# Patient Record
Sex: Female | Born: 1980 | Hispanic: No | Marital: Single | Smoking: Current every day smoker
Health system: Southern US, Community
[De-identification: ages and names within clinical notes are randomized; demographics above are authoritative.]

## PROBLEM LIST (undated history)

## (undated) DIAGNOSIS — R251 Tremor, unspecified: Secondary | ICD-10-CM

## (undated) DIAGNOSIS — E079 Disorder of thyroid, unspecified: Secondary | ICD-10-CM

## (undated) DIAGNOSIS — E119 Type 2 diabetes mellitus without complications: Secondary | ICD-10-CM

## (undated) HISTORY — PX: ADENOIDECTOMY: SUR15

---

## 2008-01-25 ENCOUNTER — Ambulatory Visit: Payer: Self-pay | Admitting: Internal Medicine

## 2008-01-25 ENCOUNTER — Encounter: Payer: Self-pay | Admitting: Family Medicine

## 2008-01-25 LAB — CONVERTED CEMR LAB
BUN: 12 mg/dL (ref 6–23)
Basophils Relative: 1 % (ref 0–1)
CO2: 26 meq/L (ref 19–32)
Calcium: 9 mg/dL (ref 8.4–10.5)
Chloride: 105 meq/L (ref 96–112)
Cholesterol: 203 mg/dL — ABNORMAL HIGH (ref 0–200)
Creatinine, Ser: 0.71 mg/dL (ref 0.40–1.20)
Eosinophils Relative: 3 % (ref 0–5)
HCT: 40.3 % (ref 36.0–46.0)
HDL: 72 mg/dL (ref >39)
Hemoglobin: 12.7 g/dL (ref 12.0–15.0)
MCHC: 31.5 g/dL (ref 30.0–36.0)
Monocytes Absolute: 0.5 10*3/uL (ref 0.1–1.0)
Monocytes Relative: 5 % (ref 3–12)
Neutro Abs: 7 10*3/uL (ref 1.7–7.7)
RBC: 5.02 M/uL (ref 3.87–5.11)
RDW: 15.3 % (ref 11.5–15.5)
Total Bilirubin: 0.7 mg/dL (ref 0.3–1.2)
Total CHOL/HDL Ratio: 2.8
VLDL: 38 mg/dL (ref 0–40)

## 2008-02-27 ENCOUNTER — Ambulatory Visit: Payer: Self-pay | Admitting: Internal Medicine

## 2008-02-27 ENCOUNTER — Encounter: Payer: Self-pay | Admitting: Family Medicine

## 2008-02-27 LAB — CONVERTED CEMR LAB
Free T4: 1.65 ng/dL (ref 0.89–1.80)
TSH: 0.005 microintl units/mL — ABNORMAL LOW (ref 0.350–4.50)

## 2008-03-04 ENCOUNTER — Ambulatory Visit: Payer: Self-pay | Admitting: *Deleted

## 2008-03-12 ENCOUNTER — Encounter (HOSPITAL_COMMUNITY): Admission: RE | Admit: 2008-03-12 | Discharge: 2008-04-15 | Payer: Self-pay | Admitting: Cardiology

## 2008-04-14 ENCOUNTER — Ambulatory Visit: Payer: Self-pay | Admitting: Internal Medicine

## 2008-04-18 ENCOUNTER — Encounter (HOSPITAL_COMMUNITY): Admission: RE | Admit: 2008-04-18 | Discharge: 2008-05-22 | Payer: Self-pay | Admitting: Internal Medicine

## 2008-05-13 ENCOUNTER — Ambulatory Visit: Payer: Self-pay | Admitting: Internal Medicine

## 2008-05-13 ENCOUNTER — Encounter: Payer: Self-pay | Admitting: Family Medicine

## 2008-05-13 LAB — CONVERTED CEMR LAB
Free T4: 3.07 ng/dL — ABNORMAL HIGH (ref 0.89–1.80)
Microalb, Ur: 0.22 mg/dL (ref 0.00–1.89)
TSH: 0.007 microintl units/mL — ABNORMAL LOW (ref 0.350–4.50)

## 2008-05-26 ENCOUNTER — Ambulatory Visit: Payer: Self-pay | Admitting: Family Medicine

## 2008-10-27 ENCOUNTER — Encounter: Payer: Self-pay | Admitting: Family Medicine

## 2008-10-27 ENCOUNTER — Ambulatory Visit: Payer: Self-pay | Admitting: Family Medicine

## 2008-12-24 ENCOUNTER — Emergency Department (HOSPITAL_BASED_OUTPATIENT_CLINIC_OR_DEPARTMENT_OTHER): Admission: EM | Admit: 2008-12-24 | Discharge: 2008-12-24 | Payer: Self-pay | Admitting: Emergency Medicine

## 2009-01-07 ENCOUNTER — Ambulatory Visit: Payer: Self-pay | Admitting: Internal Medicine

## 2009-02-10 ENCOUNTER — Ambulatory Visit: Payer: Self-pay | Admitting: Internal Medicine

## 2009-03-26 ENCOUNTER — Ambulatory Visit: Payer: Self-pay | Admitting: Internal Medicine

## 2009-07-08 ENCOUNTER — Encounter: Payer: Self-pay | Admitting: Family Medicine

## 2009-07-08 ENCOUNTER — Ambulatory Visit: Payer: Self-pay | Admitting: Internal Medicine

## 2009-07-08 LAB — CONVERTED CEMR LAB
ALT: 13 units/L (ref 0–35)
Alkaline Phosphatase: 58 units/L (ref 39–117)
Basophils Absolute: 0.1 10*3/uL (ref 0.0–0.1)
Basophils Relative: 1 % (ref 0–1)
CO2: 22 meq/L (ref 19–32)
Creatinine, Ser: 0.72 mg/dL (ref 0.40–1.20)
Eosinophils Absolute: 0.4 10*3/uL (ref 0.0–0.7)
MCHC: 31.9 g/dL (ref 30.0–36.0)
MCV: 83.2 fL (ref 78.0–100.0)
Monocytes Relative: 5 % (ref 3–12)
Neutrophils Relative %: 67 % (ref 43–77)
Platelets: 267 10*3/uL (ref 150–400)
RDW: 14.7 % (ref 11.5–15.5)
TSH: 2.515 microintl units/mL (ref 0.350–4.500)
Total Bilirubin: 0.5 mg/dL (ref 0.3–1.2)

## 2009-10-02 ENCOUNTER — Emergency Department (HOSPITAL_BASED_OUTPATIENT_CLINIC_OR_DEPARTMENT_OTHER): Admission: EM | Admit: 2009-10-02 | Discharge: 2009-10-02 | Payer: Self-pay | Admitting: Emergency Medicine

## 2009-11-26 ENCOUNTER — Emergency Department (HOSPITAL_BASED_OUTPATIENT_CLINIC_OR_DEPARTMENT_OTHER): Admission: EM | Admit: 2009-11-26 | Discharge: 2009-11-26 | Payer: Self-pay | Admitting: Emergency Medicine

## 2010-01-11 ENCOUNTER — Ambulatory Visit: Payer: Self-pay | Admitting: Internal Medicine

## 2010-03-19 ENCOUNTER — Ambulatory Visit: Payer: Self-pay | Admitting: Family Medicine

## 2010-03-22 ENCOUNTER — Ambulatory Visit: Payer: Self-pay | Admitting: Internal Medicine

## 2010-08-05 ENCOUNTER — Encounter (INDEPENDENT_AMBULATORY_CARE_PROVIDER_SITE_OTHER): Payer: Self-pay | Admitting: Internal Medicine

## 2010-11-08 ENCOUNTER — Encounter (INDEPENDENT_AMBULATORY_CARE_PROVIDER_SITE_OTHER): Payer: Self-pay | Admitting: Nurse Practitioner

## 2010-11-16 NOTE — Progress Notes (Signed)
Summary: Office Visit//HEALTH SCREEN  Office Visit//HEALTH SCREEN   Imported By: Arta Bruce 11/12/2010 16:10:30  _____________________________________________________________________  External Attachment:    Type:   Image     Comment:   External Document

## 2010-11-17 LAB — CULTURE, ROUTINE-ABSCESS: Gram Stain: NONE SEEN

## 2011-10-01 ENCOUNTER — Emergency Department (HOSPITAL_BASED_OUTPATIENT_CLINIC_OR_DEPARTMENT_OTHER)
Admission: EM | Admit: 2011-10-01 | Discharge: 2011-10-01 | Disposition: A | Discharge status: Home / Self Care | Payer: Medicaid Other | Attending: Emergency Medicine | Admitting: Emergency Medicine

## 2011-10-01 ENCOUNTER — Encounter (HOSPITAL_BASED_OUTPATIENT_CLINIC_OR_DEPARTMENT_OTHER): Payer: Self-pay | Admitting: *Deleted

## 2011-10-01 DIAGNOSIS — E079 Disorder of thyroid, unspecified: Secondary | ICD-10-CM | POA: Insufficient documentation

## 2011-10-01 DIAGNOSIS — F17200 Nicotine dependence, unspecified, uncomplicated: Secondary | ICD-10-CM | POA: Insufficient documentation

## 2011-10-01 DIAGNOSIS — L089 Local infection of the skin and subcutaneous tissue, unspecified: Secondary | ICD-10-CM

## 2011-10-01 HISTORY — DX: Tremor, unspecified: R25.1

## 2011-10-01 HISTORY — DX: Disorder of thyroid, unspecified: E07.9

## 2011-10-01 MED ORDER — DOXYCYCLINE HYCLATE 100 MG PO CAPS: 100.0000 mg | ORAL_CAPSULE | Freq: Two times a day (BID) | ORAL | Status: AC

## 2011-10-01 NOTE — ED Provider Notes (Signed)
History     CSN: [id number]  Arrival date & time 10/01/11  1751   First MD Initiated Contact with Patient 10/01/11 1803      Chief Complaint  Patient presents with  . Blister    (Consider location/radiation/quality/duration/timing/severity/associated sxs/prior treatment) HPI Comments: Pt states that she has a history of staph infection which started similarly, but her son has had ringworm and she wanted to make sure of what this spot was  Patient is a 31 y.o. female presenting with rash. The history is provided by the patient. No language interpreter was used.  Rash  This is a new problem. The current episode started more than 2 days ago. The problem has been gradually worsening. The problem is associated with an unknown factor. There has been no fever. The rash is present on the right arm. The pain is mild. The pain has been constant since onset. Associated symptoms include itching. Pertinent negatives include no weeping. She has tried antibiotic cream for the symptoms. The treatment provided no relief.    Past Medical History  Diagnosis Date  . Thyroid disease   . Tremors of nervous system     Past Surgical History  Procedure Date  . Adenoidectomy     History reviewed. No pertinent family history.  History  Substance Use Topics  . Smoking status: Current Everyday Smoker  . Smokeless tobacco: Not on file  . Alcohol Use: Yes    OB History    Grav Para Term Preterm Abortions TAB SAB Ect Mult Living                  Review of Systems  HENT: Negative.   Respiratory: Negative.   Cardiovascular: Negative.   Skin: Positive for itching and rash.    Allergies  Review of patient's allergies indicates no known allergies.  Home Medications   Current Outpatient Rx  Name Route Sig Dispense Refill  . IBUPROFEN 200 MG PO TABS Oral Take 800 mg by mouth every 6 (six) hours as needed. For pain    . LEVOTHYROXINE SODIUM 100 MCG PO TABS Oral Take 100 mcg by mouth daily.      Marland Kitchen NADOLOL 20 MG PO TABS Oral Take 20 mg by mouth daily.      BP 121/63  Pulse 62  Temp(Src) 97.9 F (36.6 C) (Oral)  Resp 18  Ht 5\' 2"  (1.575 m)  Wt 153 lb (69.4 kg)  BMI 27.98 kg/m2  SpO2 100%  Physical Exam  Nursing note and vitals reviewed. Constitutional: She appears well-developed and well-nourished.  Cardiovascular: Normal rate and regular rhythm.   Pulmonary/Chest: Effort normal.  Skin:       Pt has a localized area that is red raised and weepy    ED Course  Procedures (including critical care time)  Labs Reviewed - No data to display No results found.   1. Skin infection       MDM  Consistent with early abscess:will treat        Teressa Lower, NP 10/01/11 1821

## 2011-10-01 NOTE — ED Notes (Signed)
Pt has raised circular area to right forearm for a couple of days. Son has had ringworm twice. Itches at times.

## 2011-10-02 NOTE — ED Provider Notes (Signed)
Medical screening examination/treatment/procedure(s) were performed by non-physician practitioner and as supervising physician I was immediately available for consultation/collaboration.  Joel Mericle, MD 10/02/11 0754 

## 2013-03-13 ENCOUNTER — Ambulatory Visit: Payer: Medicaid Other | Admitting: Family

## 2014-07-11 ENCOUNTER — Emergency Department (HOSPITAL_BASED_OUTPATIENT_CLINIC_OR_DEPARTMENT_OTHER)
Admission: EM | Admit: 2014-07-11 | Discharge: 2014-07-11 | Disposition: A | Discharge status: Home / Self Care | Payer: Managed Care, Other (non HMO) | Attending: Emergency Medicine | Admitting: Emergency Medicine

## 2014-07-11 ENCOUNTER — Encounter (HOSPITAL_BASED_OUTPATIENT_CLINIC_OR_DEPARTMENT_OTHER): Payer: Self-pay | Admitting: *Deleted

## 2014-07-11 DIAGNOSIS — E079 Disorder of thyroid, unspecified: Secondary | ICD-10-CM | POA: Diagnosis not present

## 2014-07-11 DIAGNOSIS — E119 Type 2 diabetes mellitus without complications: Secondary | ICD-10-CM | POA: Insufficient documentation

## 2014-07-11 DIAGNOSIS — Z720 Tobacco use: Secondary | ICD-10-CM | POA: Diagnosis not present

## 2014-07-11 DIAGNOSIS — Z3202 Encounter for pregnancy test, result negative: Secondary | ICD-10-CM | POA: Insufficient documentation

## 2014-07-11 DIAGNOSIS — N390 Urinary tract infection, site not specified: Secondary | ICD-10-CM | POA: Insufficient documentation

## 2014-07-11 DIAGNOSIS — R5383 Other fatigue: Secondary | ICD-10-CM | POA: Diagnosis present

## 2014-07-11 DIAGNOSIS — Z79899 Other long term (current) drug therapy: Secondary | ICD-10-CM | POA: Insufficient documentation

## 2014-07-11 HISTORY — DX: Type 2 diabetes mellitus without complications: E11.9

## 2014-07-11 LAB — URINALYSIS, ROUTINE W REFLEX MICROSCOPIC
Bilirubin Urine: NEGATIVE
GLUCOSE, UA: NEGATIVE mg/dL
Hgb urine dipstick: NEGATIVE
KETONES UR: NEGATIVE mg/dL
NITRITE: NEGATIVE
PROTEIN: NEGATIVE mg/dL
Specific Gravity, Urine: 1.012 (ref 1.005–1.030)
Urobilinogen, UA: 0.2 mg/dL (ref 0.0–1.0)
pH: 5.5 (ref 5.0–8.0)

## 2014-07-11 LAB — URINE MICROSCOPIC-ADD ON

## 2014-07-11 LAB — PREGNANCY, URINE: PREG TEST UR: NEGATIVE

## 2014-07-11 MED ORDER — CEPHALEXIN 500 MG PO CAPS: 500.0000 mg | ORAL_CAPSULE | Freq: Two times a day (BID) | ORAL | Status: DC

## 2014-07-11 NOTE — ED Notes (Addendum)
Pt c/o warm, flushed feeling with fatigue that began at about 9:30 am today. She sts the episode lasted apprx. 15 min and then resolved. Pt c/o now of fatigue and hands sweating. Pt thinks it may be her thyroid. Pt was diabetic but lost weight. Her cbg this am was 109.

## 2014-07-11 NOTE — ED Provider Notes (Signed)
CSN: [id number]     Arrival date & time 07/11/14  1029 History   First MD Initiated Contact with Patient 07/11/14 1208     Chief Complaint  Patient presents with  . Fatigue     (Consider location/radiation/quality/duration/timing/severity/associated sxs/prior Treatment) HPI Comments: Pt comes in the today after she was flushed and felt fatigue. She states that after about 15 minutes the symptoms resolved. She states that she has this if her blood sugar gets low but she checked it and it was 109. She also sometimes has the symptoms when her thyroid is off. She thought maybe we could check that since she doesn't have time to go to her doctor. Denies fever, nausea, vomiting or abdominal pain.  The history is provided by the patient. No language interpreter was used.    Past Medical History  Diagnosis Date  . Thyroid disease   . Tremors of nervous system   . Diabetes mellitus without complication    Past Surgical History  Procedure Laterality Date  . Adenoidectomy     No family history on file. History  Substance Use Topics  . Smoking status: Current Every Day Smoker  . Smokeless tobacco: Not on file  . Alcohol Use: Yes   OB History    No data available     Review of Systems  All other systems reviewed and are negative.     Allergies  Review of patient's allergies indicates no known allergies.  Home Medications   Prior to Admission medications   Medication Sig Start Date End Date Taking? Authorizing Provider  cephALEXin (KEFLEX) 500 MG capsule Take 1 capsule (500 mg total) by mouth 2 (two) times daily. 07/11/14   Teressa LowerVrinda Debraann Livingstone, NP  ibuprofen (ADVIL,MOTRIN) 200 MG tablet Take 800 mg by mouth every 6 (six) hours as needed. For pain    Historical Provider, MD  levothyroxine (SYNTHROID, LEVOTHROID) 100 MCG tablet Take 100 mcg by mouth daily.    Historical Provider, MD  nadolol (CORGARD) 20 MG tablet Take 40 mg by mouth daily.     Historical Provider, MD   BP 119/80  mmHg  Pulse 60  Temp(Src) 98.9 F (37.2 C) (Oral)  Resp 14  Ht 5' 2.5" (1.588 m)  Wt 190 lb (86.183 kg)  BMI 34.18 kg/m2  SpO2 99%  LMP 06/11/2014 Physical Exam  Constitutional: She is oriented to person, place, and time. She appears well-developed and well-nourished.  HENT:  Head: Normocephalic and atraumatic.  Cardiovascular: Normal rate and regular rhythm.   Pulmonary/Chest: Effort normal and breath sounds normal.  Abdominal: Soft. Bowel sounds are normal. There is no tenderness.  Musculoskeletal: Normal range of motion.  Neurological: She is alert and oriented to person, place, and time.  Skin: Skin is warm and dry.  Psychiatric: She has a normal mood and affect.  Nursing note and vitals reviewed.   ED Course  Procedures (including critical care time) Labs Review Labs Reviewed  URINALYSIS, ROUTINE W REFLEX MICROSCOPIC - Abnormal; Notable for the following:    Leukocytes, UA TRACE (*)    All other components within normal limits  URINE MICROSCOPIC-ADD ON - Abnormal; Notable for the following:    Squamous Epithelial / LPF FEW (*)    Bacteria, UA MANY (*)    All other components within normal limits  PREGNANCY, URINE    Imaging Review No results found.   EKG Interpretation None      MDM   Final diagnoses:  UTI (lower urinary tract infection)  Will treat for uti. Discussed with pt that don't think that thyroid needs to be checked urgently. Discussed with pt that she can have that done at her doctors office    Teressa LowerVrinda Lowery Paullin, NP 07/11/14 1227  Glynn OctaveStephen Rancour, MD 07/11/14 1521

## 2014-07-11 NOTE — Discharge Instructions (Signed)

## 2015-12-28 ENCOUNTER — Emergency Department (HOSPITAL_BASED_OUTPATIENT_CLINIC_OR_DEPARTMENT_OTHER): Payer: BLUE CROSS/BLUE SHIELD

## 2015-12-28 ENCOUNTER — Encounter (HOSPITAL_BASED_OUTPATIENT_CLINIC_OR_DEPARTMENT_OTHER): Payer: Self-pay | Admitting: *Deleted

## 2015-12-28 ENCOUNTER — Emergency Department (HOSPITAL_BASED_OUTPATIENT_CLINIC_OR_DEPARTMENT_OTHER)
Admission: EM | Admit: 2015-12-28 | Discharge: 2015-12-29 | Disposition: A | Discharge status: Home / Self Care | Payer: BLUE CROSS/BLUE SHIELD | Attending: Emergency Medicine | Admitting: Emergency Medicine

## 2015-12-28 DIAGNOSIS — F17210 Nicotine dependence, cigarettes, uncomplicated: Secondary | ICD-10-CM | POA: Diagnosis not present

## 2015-12-28 DIAGNOSIS — E119 Type 2 diabetes mellitus without complications: Secondary | ICD-10-CM | POA: Insufficient documentation

## 2015-12-28 DIAGNOSIS — M79641 Pain in right hand: Secondary | ICD-10-CM | POA: Insufficient documentation

## 2015-12-28 DIAGNOSIS — R51 Headache: Secondary | ICD-10-CM | POA: Diagnosis present

## 2015-12-28 LAB — PREGNANCY, URINE: Preg Test, Ur: NEGATIVE

## 2015-12-28 MED ORDER — NAPROXEN 250 MG PO TABS
500.0000 mg | ORAL_TABLET | Freq: Once | ORAL | Status: AC
  Administered 2015-12-28: 500 mg via ORAL
  Filled 2015-12-28: qty 2

## 2015-12-28 MED ORDER — METHOCARBAMOL 500 MG PO TABS
1000.0000 mg | ORAL_TABLET | Freq: Once | ORAL | Status: DC
  Filled 2015-12-28: qty 2
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
## 2015-12-28 NOTE — ED Notes (Signed)
MD at bedside. 

## 2015-12-28 NOTE — ED Notes (Signed)
Patient transported to X-ray 

## 2015-12-29 ENCOUNTER — Encounter (HOSPITAL_BASED_OUTPATIENT_CLINIC_OR_DEPARTMENT_OTHER): Payer: Self-pay | Admitting: Emergency Medicine

## 2015-12-29 MED ORDER — METHOCARBAMOL 500 MG PO TABS: 500.0000 mg | ORAL_TABLET | Freq: Two times a day (BID) | ORAL | Status: AC

## 2015-12-29 MED ORDER — NAPROXEN 500 MG PO TABS: 500.0000 mg | ORAL_TABLET | Freq: Two times a day (BID) | ORAL | Status: AC

## 2015-12-29 NOTE — Discharge Instructions (Signed)
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
